# Patient Record
Sex: Male | Born: 1935 | Race: White | Hispanic: No | Smoking: Former smoker
Health system: Southern US, Community
[De-identification: ages and names within clinical notes are randomized; demographics above are authoritative.]

## PROBLEM LIST (undated history)

## (undated) DIAGNOSIS — I10 Essential (primary) hypertension: Secondary | ICD-10-CM

## (undated) DIAGNOSIS — E781 Pure hyperglyceridemia: Secondary | ICD-10-CM

---
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
## 2013-03-09 ENCOUNTER — Inpatient Hospital Stay (HOSPITAL_COMMUNITY)
Admission: EM | Admit: 2013-03-09 | Discharge: 2013-03-11 | DRG: 378 | Disposition: A | Discharge status: Home / Self Care | Payer: Medicare Other | Attending: Internal Medicine | Admitting: Internal Medicine

## 2013-03-09 ENCOUNTER — Emergency Department (HOSPITAL_COMMUNITY): Payer: Medicare Other

## 2013-03-09 ENCOUNTER — Encounter (HOSPITAL_COMMUNITY): Payer: Self-pay | Admitting: Emergency Medicine

## 2013-03-09 ENCOUNTER — Encounter (HOSPITAL_COMMUNITY)
Admission: EM | Disposition: A | Discharge status: Home / Self Care | Payer: Self-pay | Source: Home / Self Care | Attending: Internal Medicine

## 2013-03-09 DIAGNOSIS — R748 Abnormal levels of other serum enzymes: Secondary | ICD-10-CM | POA: Diagnosis present

## 2013-03-09 DIAGNOSIS — E872 Acidosis, unspecified: Secondary | ICD-10-CM

## 2013-03-09 DIAGNOSIS — D62 Acute posthemorrhagic anemia: Secondary | ICD-10-CM | POA: Diagnosis present

## 2013-03-09 DIAGNOSIS — K922 Gastrointestinal hemorrhage, unspecified: Secondary | ICD-10-CM | POA: Diagnosis present

## 2013-03-09 DIAGNOSIS — I10 Essential (primary) hypertension: Secondary | ICD-10-CM | POA: Diagnosis present

## 2013-03-09 DIAGNOSIS — K5731 Diverticulosis of large intestine without perforation or abscess with bleeding: Principal | ICD-10-CM | POA: Diagnosis present

## 2013-03-09 DIAGNOSIS — K644 Residual hemorrhoidal skin tags: Secondary | ICD-10-CM

## 2013-03-09 DIAGNOSIS — R7989 Other specified abnormal findings of blood chemistry: Secondary | ICD-10-CM | POA: Diagnosis present

## 2013-03-09 DIAGNOSIS — D126 Benign neoplasm of colon, unspecified: Secondary | ICD-10-CM

## 2013-03-09 DIAGNOSIS — E7800 Pure hypercholesterolemia, unspecified: Secondary | ICD-10-CM | POA: Diagnosis present

## 2013-03-09 DIAGNOSIS — I959 Hypotension, unspecified: Secondary | ICD-10-CM | POA: Diagnosis present

## 2013-03-09 DIAGNOSIS — F17200 Nicotine dependence, unspecified, uncomplicated: Secondary | ICD-10-CM | POA: Diagnosis present

## 2013-03-09 DIAGNOSIS — Z66 Do not resuscitate: Secondary | ICD-10-CM | POA: Diagnosis present

## 2013-03-09 HISTORY — PX: COLONOSCOPY: SHX5424

## 2013-03-09 HISTORY — DX: Essential (primary) hypertension: I10

## 2013-03-09 HISTORY — DX: Pure hyperglyceridemia: E78.1

## 2013-03-09 LAB — URINALYSIS W MICROSCOPIC + REFLEX CULTURE
BILIRUBIN URINE: NEGATIVE
Glucose, UA: NEGATIVE mg/dL
Hgb urine dipstick: NEGATIVE
KETONES UR: NEGATIVE mg/dL
Leukocytes, UA: NEGATIVE
NITRITE: NEGATIVE
PROTEIN: NEGATIVE mg/dL
Specific Gravity, Urine: 1.01 (ref 1.005–1.030)
Urobilinogen, UA: 0.2 mg/dL (ref 0.0–1.0)
pH: 5.5 (ref 5.0–8.0)

## 2013-03-09 LAB — PROTIME-INR
INR: 1.13 (ref 0.00–1.49)
Prothrombin Time: 14.3 seconds (ref 11.6–15.2)

## 2013-03-09 LAB — COMPREHENSIVE METABOLIC PANEL
ALT: 22 U/L (ref 0–53)
AST: 30 U/L (ref 0–37)
Albumin: 3.5 g/dL (ref 3.5–5.2)
Alkaline Phosphatase: 89 U/L (ref 39–117)
BUN: 17 mg/dL (ref 6–23)
CALCIUM: 9.3 mg/dL (ref 8.4–10.5)
CHLORIDE: 105 meq/L (ref 96–112)
CO2: 25 mEq/L (ref 19–32)
Creatinine, Ser: 0.99 mg/dL (ref 0.50–1.35)
GFR calc Af Amer: 84 mL/min — ABNORMAL LOW (ref >90)
GFR, EST NON AFRICAN AMERICAN: 73 mL/min — AB (ref >90)
GLUCOSE: 129 mg/dL — AB (ref 70–99)
Potassium: 3.7 mEq/L (ref 3.7–5.3)
SODIUM: 144 meq/L (ref 137–147)
Total Bilirubin: 0.4 mg/dL (ref 0.3–1.2)
Total Protein: 7.1 g/dL (ref 6.0–8.3)

## 2013-03-09 LAB — MRSA PCR SCREENING: MRSA by PCR: NEGATIVE

## 2013-03-09 LAB — CBC WITH DIFFERENTIAL/PLATELET
BASOS ABS: 0 10*3/uL (ref 0.0–0.1)
Basophils Relative: 0 % (ref 0–1)
EOS PCT: 1 % (ref 0–5)
Eosinophils Absolute: 0.1 10*3/uL (ref 0.0–0.7)
HCT: 37.3 % — ABNORMAL LOW (ref 39.0–52.0)
HEMOGLOBIN: 13.4 g/dL (ref 13.0–17.0)
LYMPHS ABS: 1.7 10*3/uL (ref 0.7–4.0)
Lymphocytes Relative: 14 % (ref 12–46)
MCH: 33 pg (ref 26.0–34.0)
MCHC: 35.9 g/dL (ref 30.0–36.0)
MCV: 91.9 fL (ref 78.0–100.0)
Monocytes Absolute: 1.5 10*3/uL — ABNORMAL HIGH (ref 0.1–1.0)
Monocytes Relative: 13 % — ABNORMAL HIGH (ref 3–12)
NEUTROS PCT: 72 % (ref 43–77)
Neutro Abs: 8.5 10*3/uL — ABNORMAL HIGH (ref 1.7–7.7)
Platelets: 164 10*3/uL (ref 150–400)
RBC: 4.06 MIL/uL — ABNORMAL LOW (ref 4.22–5.81)
RDW: 13.2 % (ref 11.5–15.5)
WBC: 11.7 10*3/uL — ABNORMAL HIGH (ref 4.0–10.5)

## 2013-03-09 LAB — TROPONIN I: Troponin I: <0.3 ng/mL (ref <0.30)

## 2013-03-09 LAB — CBC
HCT: 34.5 % — ABNORMAL LOW (ref 39.0–52.0)
HEMATOCRIT: 34 % — AB (ref 39.0–52.0)
Hemoglobin: 12.1 g/dL — ABNORMAL LOW (ref 13.0–17.0)
Hemoglobin: 12 g/dL — ABNORMAL LOW (ref 13.0–17.0)
MCH: 31.9 pg (ref 26.0–34.0)
MCH: 32.4 pg (ref 26.0–34.0)
MCHC: 35.1 g/dL (ref 30.0–36.0)
MCHC: 35.3 g/dL (ref 30.0–36.0)
MCV: 91 fL (ref 78.0–100.0)
MCV: 91.9 fL (ref 78.0–100.0)
Platelets: 145 10*3/uL — ABNORMAL LOW (ref 150–400)
Platelets: 163 10*3/uL (ref 150–400)
RBC: 3.79 MIL/uL — AB (ref 4.22–5.81)
RBC: 3.7 MIL/uL — ABNORMAL LOW (ref 4.22–5.81)
RDW: 13.2 % (ref 11.5–15.5)
RDW: 13.4 % (ref 11.5–15.5)
WBC: 8.5 10*3/uL (ref 4.0–10.5)
WBC: 9.1 10*3/uL (ref 4.0–10.5)

## 2013-03-09 LAB — LIPASE, BLOOD: Lipase: 60 U/L — ABNORMAL HIGH (ref 11–59)

## 2013-03-09 LAB — SAMPLE TO BLOOD BANK

## 2013-03-09 LAB — TYPE AND SCREEN
ABO/RH(D): O POS
Antibody Screen: NEGATIVE

## 2013-03-09 LAB — LACTIC ACID, PLASMA: Lactic Acid, Venous: 3.6 mmol/L — ABNORMAL HIGH (ref 0.5–2.2)

## 2013-03-09 SURGERY — COLONOSCOPY
Anesthesia: Moderate Sedation

## 2013-03-09 MED ORDER — MIDAZOLAM HCL 5 MG/5ML IJ SOLN
INTRAMUSCULAR | Status: DC | PRN
  Administered 2013-03-09: 2 mg via INTRAVENOUS
  Administered 2013-03-09: 1 mg via INTRAVENOUS

## 2013-03-09 MED ORDER — ACETAMINOPHEN 325 MG PO TABS: 650.0000 mg | ORAL_TABLET | Freq: Four times a day (QID) | ORAL | Status: DC | PRN

## 2013-03-09 MED ORDER — IOHEXOL 300 MG/ML  SOLN
100.0000 mL | Freq: Once | INTRAMUSCULAR | Status: AC | PRN
  Administered 2013-03-09: 100 mL via INTRAVENOUS

## 2013-03-09 MED ORDER — SODIUM CHLORIDE 0.9 % IV BOLUS (SEPSIS)
500.0000 mL | Freq: Once | INTRAVENOUS | Status: AC
  Administered 2013-03-09: 500 mL via INTRAVENOUS

## 2013-03-09 MED ORDER — MEPERIDINE HCL 50 MG/ML IJ SOLN
INTRAMUSCULAR | Status: AC
  Filled 2013-03-09: qty 1

## 2013-03-09 MED ORDER — ACETAMINOPHEN 650 MG RE SUPP: 650.0000 mg | Freq: Four times a day (QID) | RECTAL | Status: DC | PRN

## 2013-03-09 MED ORDER — ONDANSETRON HCL 4 MG PO TABS: 4.0000 mg | ORAL_TABLET | Freq: Four times a day (QID) | ORAL | Status: DC | PRN

## 2013-03-09 MED ORDER — SODIUM CHLORIDE 0.9 % IV BOLUS (SEPSIS)
500.0000 mL | Freq: Once | INTRAVENOUS | Status: AC
  Administered 2013-03-09: 06:00:00 via INTRAVENOUS

## 2013-03-09 MED ORDER — POTASSIUM CHLORIDE IN NACL 20-0.9 MEQ/L-% IV SOLN
INTRAVENOUS | Status: DC
  Administered 2013-03-09 – 2013-03-10 (×3): via INTRAVENOUS

## 2013-03-09 MED ORDER — SODIUM CHLORIDE 0.9 % IV SOLN
INTRAVENOUS | Status: DC
  Administered 2013-03-09 (×2): via INTRAVENOUS

## 2013-03-09 MED ORDER — IOHEXOL 300 MG/ML  SOLN
50.0000 mL | Freq: Once | INTRAMUSCULAR | Status: AC | PRN
Start: 2013-03-09 — End: 2013-03-09
  Administered 2013-03-09: 50 mL via ORAL

## 2013-03-09 MED ORDER — SODIUM CHLORIDE 0.9 % IV SOLN
INTRAVENOUS | Status: DC
  Administered 2013-03-09: 1000 mL via INTRAVENOUS

## 2013-03-09 MED ORDER — PEG 3350-KCL-NABCB-NACL-NASULF 236 G PO SOLR
4000.0000 mL | Freq: Once | ORAL | Status: DC
  Filled 2013-03-09: qty 4000

## 2013-03-09 MED ORDER — MIDAZOLAM HCL 5 MG/5ML IJ SOLN
INTRAMUSCULAR | Status: AC
  Filled 2013-03-09: qty 10

## 2013-03-09 MED ORDER — PANTOPRAZOLE SODIUM 40 MG IV SOLR
40.0000 mg | Freq: Once | INTRAVENOUS | Status: AC
  Administered 2013-03-09: 40 mg via INTRAVENOUS
  Filled 2013-03-09: qty 40

## 2013-03-09 MED ORDER — MEPERIDINE HCL 50 MG/ML IJ SOLN
INTRAMUSCULAR | Status: DC | PRN
  Administered 2013-03-09: 25 mg via INTRAVENOUS

## 2013-03-09 MED ORDER — STERILE WATER FOR IRRIGATION IR SOLN
Status: DC | PRN
  Administered 2013-03-09: 14:00:00

## 2013-03-09 MED ORDER — ONDANSETRON HCL 4 MG/2ML IJ SOLN: 4.0000 mg | Freq: Three times a day (TID) | INTRAMUSCULAR | Status: DC | PRN

## 2013-03-09 MED ORDER — SODIUM CHLORIDE 0.9 % IV SOLN: INTRAVENOUS | Status: DC

## 2013-03-09 MED ORDER — PANTOPRAZOLE SODIUM 40 MG IV SOLR
40.0000 mg | Freq: Two times a day (BID) | INTRAVENOUS | Status: DC
  Administered 2013-03-09 – 2013-03-10 (×3): 40 mg via INTRAVENOUS
  Filled 2013-03-09 (×4): qty 40

## 2013-03-09 MED ORDER — ONDANSETRON HCL 4 MG/2ML IJ SOLN
4.0000 mg | Freq: Four times a day (QID) | INTRAMUSCULAR | Status: DC | PRN
Start: 2013-03-09 — End: 2013-03-11

## 2013-03-09 NOTE — ED Provider Notes (Signed)
CT with diverticulosis.  HR 110s/  SBP 150.  Abdomen soft and nontender. Admission to stepdown d/w Dr. Roderic Palau. D/w Dr. Laural Golden of GI who will consult.   BP 154/65  Pulse 108  Temp(Src) 97.9 F (36.6 C) (Oral)  Resp 20  Ht 5' 5.5" (1.664 m)  Wt 188 lb (85.276 kg)  BMI 30.80 kg/m2  SpO2 98%   Ezequiel Essex, MD 03/09/13 [phone]

## 2013-03-09 NOTE — H&P (Signed)
[facility] History and Physical  Therron Sells QJJ:[id number] DOB: 08-23-1925 DOA: 03/09/2013  Referring physician: Dr. Wyvonnia Dusky, ER physician PCP: Kendrick Ranch, MD   Chief Complaint: rectal bleeding  HPI: Charles Ballard is a 80 y.o. male with history of hypertension who presents to the emergency room with complaints of rectal bleeding. Patient reports onset of symptoms occurring yesterday. He describes stools as dark in color, maroon color. He's had several episodes since onset, one in the emergency room and one after arrival to the step down unit. He began feeling lightheaded and dizzy which is when he called EMS. He was reported to be hypotensive with systolic blood pressures in the 80s on arrival. This improved with IV fluids. The patient denies any chest pain, shortness of breath, abdominal pain, vomiting, fever, cough or complaints. He has not been on NSAID, anticoagulation or antiplatelet agents. CT scan of the abdomen and pelvis was done in the emergency room which indicated diverticulosis but no acute findings. Patient will be admitted to the hospital for further treatments. He reports having a colonoscopy over 10 years ago in [place].   Review of Systems: Pertinent positives as per HPI, otherwise negative  Past Medical History  Diagnosis Date  . Hypertension   . High cholesterol    History reviewed. No pertinent past surgical history. Social History:  reports that he has been smoking.  He does not have any smokeless tobacco history on file. He reports that he does not drink alcohol or use illicit drugs.  No Known Allergies  Family history: father died due to complications of alcoholic cirrhosis, mother died in her 58s with dementia   Prior to Admission medications   Medication Sig Start Date End Date Taking? Authorizing Provider  lisinopril (PRINIVIL,ZESTRIL) 20 MG tablet Take 20 mg by mouth daily.   Yes Historical Provider, MD  Omega-3 Fatty Acids (FISH  OIL) 1000 MG CAPS Take by mouth.   Yes Historical Provider, MD   Physical Exam: Filed Vitals:   03/09/13 0836  BP: 144/71  Pulse: 105  Temp:   Resp:     BP 144/71  Pulse 105  Temp(Src) 97.9 F (36.6 C) (Oral)  Resp 20  Ht 5\' 5"  (1.651 m)  Wt 88.5 kg (195 lb 1.7 oz)  BMI 32.47 kg/m2  SpO2 97%  General:  Appears calm and comfortable Eyes: PERRL, normal lids, irises & conjunctiva ENT: grossly normal hearing, lips & tongue Neck: no LAD, masses or thyromegaly Cardiovascular: RRR, no m/r/g. No LE edema. Telemetry: SR, no arrhythmias  Respiratory: CTA bilaterally, no w/r/r. Normal respiratory effort. Abdomen: soft, ntnd Skin: no rash or induration seen on limited exam Musculoskeletal: grossly normal tone BUE/BLE Psychiatric: grossly normal mood and affect, speech fluent and appropriate Neurologic: grossly non-focal.          Labs on Admission:  Basic Metabolic Panel:  Recent Labs Lab 03/09/13 0523  NA 144  K 3.7  CL 105  CO2 25  GLUCOSE 129*  BUN 17  CREATININE 0.99  CALCIUM 9.3   Liver Function Tests:  Recent Labs Lab 03/09/13 0523  AST 30  ALT 22  ALKPHOS 89  BILITOT 0.4  PROT 7.1  ALBUMIN 3.5    Recent Labs Lab 03/09/13 0523  LIPASE 60*   No results found for this basename: AMMONIA,  in the last 168 hours CBC:  Recent Labs Lab 03/09/13 0523  WBC 11.7*  NEUTROABS 8.5*  HGB 13.4  HCT 37.3*  MCV 91.9  PLT 164  Cardiac Enzymes:  Recent Labs Lab 03/09/13 0523  TROPONINI <0.30    BNP (last 3 results) No results found for this basename: PROBNP,  in the last 8760 hours CBG: No results found for this basename: GLUCAP,  in the last 168 hours  Radiological Exams on Admission: Dg Chest 2 View  03/09/2013   CLINICAL DATA:  Rectal bleeding.  EXAM: CHEST  2 VIEW  COMPARISON:  None available for comparison at time of study interpretation.  FINDINGS: Cardiomediastinal silhouette is nonsuspicious. Slight increased lung volumes and  interstitial prominence without pleural effusions or focal consolidations. No pneumothorax. Soft tissue planes and included osseous structures are nonsuspicious.  IMPRESSION: Mild COPD without superimposed acute cardiopulmonary process.   Electronically Signed   By: Elon Alas   On: 03/09/2013 06:59   Ct Abdomen Pelvis W Contrast  03/09/2013   CLINICAL DATA:  Rectal bleeding  EXAM: CT ABDOMEN AND PELVIS WITH CONTRAST  TECHNIQUE: Multidetector CT imaging of the abdomen and pelvis was performed using the standard protocol following bolus administration of intravenous contrast.  CONTRAST:  60mL OMNIPAQUE IOHEXOL 300 MG/ML SOLN, 15mL OMNIPAQUE IOHEXOL 300 MG/ML SOLN  COMPARISON:  None.  FINDINGS: BODY WALL: Unremarkable.  LOWER CHEST: Unremarkable.  ABDOMEN/PELVIS:  Liver: No focal abnormality.  Possible fatty infiltration.  Biliary: Stones layering in the gallbladder. No wall thickening or gallbladder distention.  Pancreas: Unremarkable.  Spleen: Unremarkable.  Adrenals: Unremarkable.  Kidneys and ureters: 2 cm cyst exophytic from the lower pole right kidney. No hydronephrosis or nephrolithiasis.  Bladder: Unremarkable.  Reproductive: Mild enlargement of the prostate, deforming the bladder base.  Bowel: Colonic diverticulosis, mild for age. No other identifiable cause of lower GI bleed, including evidence of mass, ischemic colitis, or hemorrhoids. There is contrast in the ascending colon. No abnormal vascularity in the region. Normal appendix. 5 mm filling defect along the medial wall of the mid duodenum is likely the normal papilla. No ductal enlargement of the pancreas or biliary tree.  Retroperitoneum: No mass or adenopathy.  Peritoneum: No free fluid or gas.  Vascular: No acute abnormality.  OSSEOUS: No acute abnormalities. 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  IMPRESSION: 1. Colonic  diverticulosis. No other abnormality to explain GI bleeding. 2. Cholelithiasis.   Electronically Signed   By: Jorje Guild M.D.   On: 03/09/2013 07:04    EKG: Independently reviewed. Sinus tachy  Assessment/Plan Active Problems:   GI bleed   Hypertension   Elevated lipase   Elevated lactic acid level   1. GI bleeding. Appears to be lower GI bleed. We'll prophylactically place him on Protonix for now. Gastroenterology has been consulted. We'll keep the patient n.p.o. until seen by GI. We will cycle CBCs every 8 hours and transfuse as necessary. Hemoglobin appears to be stable at this time. 2. Elevated lipase. Does not appear to be clinically significant at this time. 3. Elevated lactic acid. Likely related to hypotension prior to admission. Will continue to follow. 4. Hypertension. Patient was taking lisinopril prior to admission. This will be held during this hospitalization. It can be restarted in the outpatient setting.  Code Status: DNR but not DNI Family Communication: none present, discussed with patient Disposition Plan: discharge home once improved  Time spent: 78mins  Borghild Thaker [facility] Pager [phone]

## 2013-03-09 NOTE — ED Notes (Signed)
Call next door neighbor Alta Corning @ [phone]

## 2013-03-09 NOTE — ED Notes (Signed)
He had a rectal bleed, seen some blood in the toilet per EMS. BP 89/62 NS bolus given and he was 518 Systolic after bolus.

## 2013-03-09 NOTE — Consult Note (Signed)
Referring Provider: No ref. provider found Primary Care Physician:  Kendrick Ranch, MD Primary Gastroenterologist:  Dr. Laural Golden  Reason for Consultation:  Rectal bleeding.  HPI:  Patient is a 80-year-old Caucasian male with history of colonic adenomas whose last colonoscopy was 10 years who noted burgundy maroon blood with bowel movement around 10 PM. He passed large amount of blood. He had 2 more bowel movements and each time he passed large blood. He did not experience abdominal pain nausea or vomiting but he became lightheaded and dizzy. He therefore called for help and was brought to ER by EMS. On initial presentation he was hypotensive and responded to IV fluids. He had another bowel movement in the emergency room and 2 while in ICU. He did not experience chest pain shortness of breath. He denies diarrhea constipation anorexia or weight loss. He has taken Advil in the past but not recently. She was admitted to ICU. H&H on admission was 13.4 and 37.3 He had another H&H at 10 AM it was 12.1 and 34.5. He is retired. He lost his wife 2 years ago. He lives alone in town in [place]. He smoked cigarettes for 10 years but quit in 1965. He drinks alcohol occasionally. He has 2 biologic children and stepson.   Past Medical History  Diagnosis Date  . Hypertension   . High cholesterol       bilateral cataract surgery. History of colonic polyps. Last colonoscopy was in [place] about 10 years ago.  History reviewed. No pertinent past surgical history.  Prior to Admission medications   Medication Sig Start Date End Date Taking? Authorizing Provider  lisinopril (PRINIVIL,ZESTRIL) 20 MG tablet Take 20 mg by mouth daily.   Yes Historical Provider, MD  Omega-3 Fatty Acids (FISH OIL) 1000 MG CAPS Take by mouth.   Yes Historical Provider, MD    Current Facility-Administered Medications  Medication Dose Route Frequency Provider Last Rate Last Dose  . 0.9 % NaCl with KCl 20  mEq/ L  infusion   Intravenous Continuous Kathie Dike, MD 75 mL/hr at 03/09/13 0950    . acetaminophen (TYLENOL) tablet 650 mg  650 mg Oral Q6H PRN Kathie Dike, MD       Or  . acetaminophen (TYLENOL) suppository 650 mg  650 mg Rectal Q6H PRN Kathie Dike, MD      . ondansetron (ZOFRAN) tablet 4 mg  4 mg Oral Q6H PRN Kathie Dike, MD       Or  . ondansetron (ZOFRAN) injection 4 mg  4 mg Intravenous Q6H PRN Kathie Dike, MD      . pantoprazole (PROTONIX) injection 40 mg  40 mg Intravenous Q12H Kathie Dike, MD   40 mg at 03/09/13 0950    Allergies as of 03/09/2013  . (No Known Allergies)    No family history on file.  History   Social History  . Marital Status: Widowed    Spouse Name: N/A    Number of Children: N/A  . Years of Education: N/A   Occupational History  . Not on file.   Social History Main Topics  . Smoking status: Current Every Day Smoker  . Smokeless tobacco: Not on file  . Alcohol Use: No  . Drug Use: No  . Sexual Activity: Not on file   Other Topics Concern  . Not on file   Social History Narrative  . No narrative on file    Review of Systems: See HPI, otherwise normal ROS  Physical Exam: Temp:  [  97.9 F (36.6 C)] 97.9 F (36.6 C) (01/14 0425) Pulse Rate:  [88-121] 105 (01/14 0836) Resp:  [11-21] 20 (01/14 0803) BP: (106-154)/(54-86) 144/71 mmHg (01/14 0836) SpO2:  [95 %-98 %] 97 % (01/14 0836) Weight:  [188 lb (85.276 kg)-195 lb 1.7 oz (88.5 kg)] 195 lb 1.7 oz (88.5 kg) (01/14 0836)  Pleasant well-developed well-nourished Caucasian male who is in no acute distress.  Conjunctivae was pink. Sclerae nonicteric. Oropharyngeal mucosa is normal. He has upper lower dentures in place. No neck masses or thyromegaly noted. Cardiac exam with a regular rhythm normal S1 and S2. No murmur or gallop noted. Lungs are clear to auscultation. Abdomen is symmetrical. Bowel sounds are normal. On palpation is soft and nontender without organomegaly or  masses. Rectal examination deferred. No peripheral edema or clubbing noted.  Lab Results:  Recent Labs  03/09/13 0523  WBC 11.7*  HGB 13.4  HCT 37.3*  PLT 164   BMET  Recent Labs  03/09/13 0523  NA 144  K 3.7  CL 105  CO2 25  GLUCOSE 129*  BUN 17  CREATININE 0.99  CALCIUM 9.3   LFT  Recent Labs  03/09/13 0523  PROT 7.1  ALBUMIN 3.5  AST 30  ALT 22  ALKPHOS 89  BILITOT 0.4   PT/INR  Recent Labs  03/09/13 0530  LABPROT 14.3  INR 1.13   Hepatitis Panel No results found for this basename: HEPBSAG, HCVAB, HEPAIGM, HEPBIGM,  in the last 72 hours  Studies/Results: Dg Chest 2 View  03/09/2013   CLINICAL DATA:  Rectal bleeding.  EXAM: CHEST  2 VIEW  COMPARISON:  None available for comparison at time of study interpretation.  FINDINGS: Cardiomediastinal silhouette is nonsuspicious. Slight increased lung volumes and interstitial prominence without pleural effusions or focal consolidations. No pneumothorax. Soft tissue planes and included osseous structures are nonsuspicious.  IMPRESSION: Mild COPD without superimposed acute cardiopulmonary process.   Electronically Signed   By: Elon Alas   On: 03/09/2013 06:59   Ct Abdomen Pelvis W Contrast  03/09/2013   CLINICAL DATA:  Rectal bleeding  EXAM: CT ABDOMEN AND PELVIS WITH CONTRAST  TECHNIQUE: Multidetector CT imaging of the abdomen and pelvis was performed using the standard protocol following bolus administration of intravenous contrast.  CONTRAST:  71mL OMNIPAQUE IOHEXOL 300 MG/ML SOLN, 154mL OMNIPAQUE IOHEXOL 300 MG/ML SOLN  COMPARISON:  None.  FINDINGS: BODY WALL: Unremarkable.  LOWER CHEST: Unremarkable.  ABDOMEN/PELVIS:  Liver: No focal abnormality.  Possible fatty infiltration.  Biliary: Stones layering in the gallbladder. No wall thickening or gallbladder distention.  Pancreas: Unremarkable.  Spleen: Unremarkable.  Adrenals: Unremarkable.  Kidneys and ureters: 2 cm cyst exophytic from the lower pole right  kidney. No hydronephrosis or nephrolithiasis.  Bladder: Unremarkable.  Reproductive: Mild enlargement of the prostate, deforming the bladder base.  Bowel: Colonic diverticulosis, mild for age. No other identifiable cause of lower GI bleed, including evidence of mass, ischemic colitis, or hemorrhoids. There is contrast in the ascending colon. No abnormal vascularity in the region. Normal appendix. 5 mm filling defect along the medial wall of the mid duodenum is likely the normal papilla. No ductal enlargement of the pancreas or biliary tree.  Retroperitoneum: No mass or adenopathy.  Peritoneum: No free fluid or gas.  Vascular: No acute abnormality.  OSSEOUS: No acute abnormalities. 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  IMPRESSION: 1. Colonic diverticulosis. No other abnormality to explain GI bleeding. 2. Cholelithiasis.  Electronically Signed   By: Jorje Guild M.D.   On: 03/09/2013 07:04    Assessment; Large-volume painless hematochezia and a healthy-appearing 80 year old Caucasian male who has history of colonic polyps and CT revealed colonic diverticulosis. He was hypotensive on presentation but now his blood pressure is normal. His hemoglobin has dropped by 1 g further drop as expected with hemodilution. Colonic diverticula bleed is suspected. Patient noted to have elevated lactic acid levels on admission most likely secondary to hypotension and poor perfusion. He does not appear to be acutely ill.  Recommendations; Request colonoscopy records from [facility] in [place]. Diagnostic colonoscopy to be performed this afternoon once patient has been prepped. Procedure reviewed with the patient he is agreeable.   LOS: 0 days   Mykaela Arena U  03/09/2013, 9:56 AM

## 2013-03-09 NOTE — Op Note (Signed)
COLONOSCOPY PROCEDURE REPORT  PATIENT:  Charles Ballard  MR#:  [id number] Birthdate:  1925-06-10, 80 y.o., male Endoscopist:  Dr. Rogene Houston, MD Referred By:  Dr. Kathie Dike, MD  Procedure Date: 03/09/2013  Procedure:   Colonoscopy  Indications:  Patient is an 80 year old Caucasian male who presents with large volume painless hematochezia. He has history of colonic polyps. His last colonoscopy was 10 years ago. He had CT on admission revealing colonic diverticulosis. He is undergoing diagnostic colonoscopy.  Informed Consent:  The procedure and risks were reviewed with the patient and informed consent was obtained.  Medications:  Demerol 25 mg IV Versed 3 mg IV  Description of procedure:  After a digital rectal exam was performed, that colonoscope was advanced from the anus through the rectum and colon to the area of the cecum, ileocecal valve and appendiceal orifice. The cecum was deeply intubated. These structures were well-seen and photographed for the record. From the level of the cecum and ileocecal valve, the scope was slowly and cautiously withdrawn. The mucosal surfaces were carefully surveyed utilizing scope tip to flexion to facilitate fold flattening as needed. The scope was pulled down into the rectum where a thorough exam including retroflexion was performed. Terminal ileum was also examined.  Findings:   Prep excellent. Normal mucosa of terminal ileum. Two small polyps were ablated and submitted together. One from cecum was removed using cold biopsy forceps and the second one from the hepatic flexure was cold snared. Single diverticulum at hepatic flexure with multiple diverticula at sigmoid colon but none with stigmata of bleed. Normal rectal mucosa. Prominent hemorrhoids below the dentate line.   Therapeutic/Diagnostic Maneuvers Performed:  See above  Complications:  None  Cecal Withdrawal Time:  10 minutes  Impression:  Normal mucosa of terminal  ileum. Two small polyps were removed as above and submitted together. These are located at cecum and hepatic flexure. Multiple diverticula at sigmoid colon along with one of hepatic flexure without stigmata of bleed. External hemorrhoids.   Comment; Suspect colonic diverticular bleed. No further workup unless evidence of recurrent bleed.    Recommendations:  Full liquid diet. Continue to monitor H&H and for evidence of rebleed. No aspirin or NSAIDs for 2 weeks.  Lason Eveland U  03/09/2013 2:40 PM  CC: Dr. Kendrick Ranch, MD & Dr. Rayne Du ref. provider found

## 2013-03-09 NOTE — ED Notes (Signed)
Large amount of bright red blood noted in stool, pt A/O X4, denies pain, VSS

## 2013-03-09 NOTE — ED Provider Notes (Signed)
CSN: [id number]     Arrival date & time 03/09/13  0425 History   First MD Initiated Contact with Patient 03/09/13 [phone]     Chief Complaint  Patient presents with  . Rectal Bleeding    HPI Pt was seen at 0440. Per EMS and pt report, c/o gradual onset and persistence of 3 episodes of bloody stools that began last night. Pt states his BM's were "red blood" and "liquid" only. States he also noticed blood on the toilet paper when he wiped after having the BM's. EMS states pt's BP was "89/62" on their arrival to scene, and increased to "SBP 139" after IVF bolus. Denies abd pain, no N/V, no rectal pain, no back pain, no CP/SOB.    Past Medical History  Diagnosis Date  . Hypertension   . High cholesterol    History reviewed. No pertinent past surgical history.  History  Substance Use Topics  . Smoking status: Current Every Day Smoker  . Smokeless tobacco: Not on file  . Alcohol Use: No    Review of Systems ROS: Statement: All systems negative except as marked or noted in the HPI; Constitutional: Negative for fever and chills. ; ; Eyes: Negative for eye pain, redness and discharge. ; ; ENMT: Negative for ear pain, hoarseness, nasal congestion, sinus pressure and sore throat. ; ; Cardiovascular: Negative for chest pain, palpitations, diaphoresis, dyspnea and peripheral edema. ; ; Respiratory: Negative for cough, wheezing and stridor. ; ; Gastrointestinal: Negative for nausea, vomiting, diarrhea, abdominal pain, hematemesis, jaundice and +bloody stool, +rectal bleeding. . ; ; Genitourinary: Negative for dysuria, flank pain and hematuria. ; ; Musculoskeletal: Negative for back pain and neck pain. Negative for swelling and trauma.; ; Skin: Negative for pruritus, rash, abrasions, blisters, bruising and skin lesion.; ; Neuro: Negative for headache, lightheadedness and neck stiffness. Negative for weakness, altered level of consciousness , altered mental status, extremity weakness, paresthesias,  involuntary movement, seizure and syncope.       Allergies  Review of patient's allergies indicates no known allergies.  Home Medications   Current Outpatient Rx  Name  Route  Sig  Dispense  Refill  . lisinopril (PRINIVIL,ZESTRIL) 20 MG tablet   Oral   Take 20 mg by mouth daily.         . Omega-3 Fatty Acids (FISH OIL) 1000 MG CAPS   Oral   Take by mouth.          BP 127/60  Pulse 121  Temp(Src) 97.9 F (36.6 C) (Oral)  Resp 21  Ht 5' 5.5" (1.664 m)  Wt 188 lb (85.276 kg)  BMI 30.80 kg/m2  SpO2 98% Filed Vitals:   03/09/13 0451 03/09/13 0453 03/09/13 0500 03/09/13 0600  BP: 121/59 127/60 106/58 136/63  Pulse: 118 121  89  Temp:      TempSrc:      Resp:   11 18  Height:      Weight:      SpO2:    98%    Physical Exam 0445: Physical examination:  Nursing notes reviewed; Vital signs and O2 SAT reviewed;  Constitutional: Well developed, Well nourished, Well hydrated, In no acute distress; Head:  Normocephalic, atraumatic; Eyes: EOMI, PERRL, No scleral icterus; ENMT: Mouth and pharynx normal, Mucous membranes moist; Neck: Supple, Full range of motion, No lymphadenopathy; Cardiovascular: Tachycardic rate and rhythm, No murmur, rub, or gallop; Respiratory: Breath sounds clear & equal bilaterally, No wheezes.  Speaking full sentences with ease, Normal respiratory effort/excursion; Chest: Nontender,  Movement normal; Abdomen: Soft, Nontender, Nondistended, Normal bowel sounds. Rectal exam performed w/permission of pt and ED Tech chaperone present.  Anal tone normal.  Non-tender, soft maroon stool in rectal vault, heme positive.  No fissures, no external hemorrhoids, no palp masses.;; Genitourinary: No CVA tenderness; Extremities: Pulses normal, No tenderness, No edema, No calf edema or asymmetry.; Neuro: AA&Ox3, Major CN grossly intact.  Speech clear. No gross focal motor or sensory deficits in extremities.; Skin: Color normal, Warm, Dry.   ED Course  Procedures     EKG  Interpretation    Date/Time:  Wednesday March 09 2013 04:52:23 EST Ventricular Rate:  103 PR Interval:  140 QRS Duration: 84 QT Interval:  366 QTC Calculation: 479 R Axis:   16 Text Interpretation:  Sinus tachycardia Otherwise normal ECG No previous ECGs available Confirmed by [facility]  MD, Nunzio Cory [id number]) on 03/09/2013 5:10:46 AM            MDM  MDM Reviewed: previous chart, nursing note and vitals Interpretation: labs, x-ray, CT scan and ECG     Results for orders placed during the hospital encounter of 03/09/13  CBC WITH DIFFERENTIAL      Result Value Range   WBC 11.7 (*) 4.0 - 10.5 K/uL   RBC 4.06 (*) 4.22 - 5.81 MIL/uL   Hemoglobin 13.4  13.0 - 17.0 g/dL   HCT 37.3 (*) 39.0 - 52.0 %   MCV 91.9  78.0 - 100.0 fL   MCH 33.0  26.0 - 34.0 pg   MCHC 35.9  30.0 - 36.0 g/dL   RDW 13.2  11.5 - 15.5 %   Platelets 164  150 - 400 K/uL   Neutrophils Relative % 72  43 - 77 %   Neutro Abs 8.5 (*) 1.7 - 7.7 K/uL   Lymphocytes Relative 14  12 - 46 %   Lymphs Abs 1.7  0.7 - 4.0 K/uL   Monocytes Relative 13 (*) 3 - 12 %   Monocytes Absolute 1.5 (*) 0.1 - 1.0 K/uL   Eosinophils Relative 1  0 - 5 %   Eosinophils Absolute 0.1  0.0 - 0.7 K/uL   Basophils Relative 0  0 - 1 %   Basophils Absolute 0.0  0.0 - 0.1 K/uL  COMPREHENSIVE METABOLIC PANEL      Result Value Range   Sodium 144  137 - 147 mEq/L   Potassium 3.7  3.7 - 5.3 mEq/L   Chloride 105  96 - 112 mEq/L   CO2 25  19 - 32 mEq/L   Glucose, Bld 129 (*) 70 - 99 mg/dL   BUN 17  6 - 23 mg/dL   Creatinine, Ser 0.99  0.50 - 1.35 mg/dL   Calcium 9.3  8.4 - 10.5 mg/dL   Total Protein 7.1  6.0 - 8.3 g/dL   Albumin 3.5  3.5 - 5.2 g/dL   AST 30  0 - 37 U/L   ALT 22  0 - 53 U/L   Alkaline Phosphatase 89  39 - 117 U/L   Total Bilirubin 0.4  0.3 - 1.2 mg/dL   GFR calc non Af Amer 73 (*) >90 mL/min   GFR calc Af Amer 84 (*) >90 mL/min  LIPASE, BLOOD      Result Value Range   Lipase 60 (*) 11 - 59 U/L  LACTIC ACID, PLASMA       Result Value Range   Lactic Acid, Venous 3.6 (*) 0.5 - 2.2 mmol/L  TROPONIN I  Result Value Range   Troponin I <0.30  <0.30 ng/mL  SAMPLE TO BLOOD BANK      Result Value Range   Blood Bank Specimen SAMPLE AVAILABLE FOR TESTING     Sample Expiration 03/12/2013       0705:  Orthostatic VS performed, SBP stable but pt did have increasing tachycardia and c/o feeling "lightheaded" when stood. Judicious IVF bolus given with improvement in tachycardia. H/H stable at this time. Abd continues benign. CT A/P and XR results pending. Sign out to Dr. Wyvonnia Dusky.    Alfonzo Feller, DO 03/09/13 [phone]

## 2013-03-10 DIAGNOSIS — D62 Acute posthemorrhagic anemia: Secondary | ICD-10-CM

## 2013-03-10 LAB — CBC
HCT: 30.8 % — ABNORMAL LOW (ref 39.0–52.0)
HEMATOCRIT: 30.4 % — AB (ref 39.0–52.0)
HEMATOCRIT: 34.2 % — AB (ref 39.0–52.0)
HEMATOCRIT: 34.7 % — AB (ref 39.0–52.0)
HEMOGLOBIN: 12 g/dL — AB (ref 13.0–17.0)
Hemoglobin: 11 g/dL — ABNORMAL LOW (ref 13.0–17.0)
Hemoglobin: 10.8 g/dL — ABNORMAL LOW (ref 13.0–17.0)
Hemoglobin: 12 g/dL — ABNORMAL LOW (ref 13.0–17.0)
MCH: 32.6 pg (ref 26.0–34.0)
MCH: 32.5 pg (ref 26.0–34.0)
MCH: 32.3 pg (ref 26.0–34.0)
MCH: 32.2 pg (ref 26.0–34.0)
MCHC: 35.7 g/dL (ref 30.0–36.0)
MCHC: 35.5 g/dL (ref 30.0–36.0)
MCHC: 35.1 g/dL (ref 30.0–36.0)
MCHC: 34.6 g/dL (ref 30.0–36.0)
MCV: 91.4 fL (ref 78.0–100.0)
MCV: 91.6 fL (ref 78.0–100.0)
MCV: 91.9 fL (ref 78.0–100.0)
MCV: 93 fL (ref 78.0–100.0)
Platelets: 127 10*3/uL — ABNORMAL LOW (ref 150–400)
Platelets: 135 10*3/uL — ABNORMAL LOW (ref 150–400)
Platelets: 147 10*3/uL — ABNORMAL LOW (ref 150–400)
Platelets: 154 10*3/uL (ref 150–400)
RBC: 3.37 MIL/uL — ABNORMAL LOW (ref 4.22–5.81)
RBC: 3.32 MIL/uL — ABNORMAL LOW (ref 4.22–5.81)
RBC: 3.72 MIL/uL — AB (ref 4.22–5.81)
RBC: 3.73 MIL/uL — ABNORMAL LOW (ref 4.22–5.81)
RDW: 13.4 % (ref 11.5–15.5)
RDW: 13.5 % (ref 11.5–15.5)
RDW: 13.5 % (ref 11.5–15.5)
RDW: 13.5 % (ref 11.5–15.5)
WBC: 5.7 10*3/uL (ref 4.0–10.5)
WBC: 5.7 10*3/uL (ref 4.0–10.5)
WBC: 5.6 10*3/uL (ref 4.0–10.5)
WBC: 7.8 10*3/uL (ref 4.0–10.5)

## 2013-03-10 LAB — LACTIC ACID, PLASMA: Lactic Acid, Venous: 2.6 mmol/L — ABNORMAL HIGH (ref 0.5–2.2)

## 2013-03-10 LAB — BASIC METABOLIC PANEL
BUN: 10 mg/dL (ref 6–23)
CO2: 25 meq/L (ref 19–32)
Calcium: 8.3 mg/dL — ABNORMAL LOW (ref 8.4–10.5)
Chloride: 109 mEq/L (ref 96–112)
Creatinine, Ser: 0.85 mg/dL (ref 0.50–1.35)
GFR calc non Af Amer: 77 mL/min — ABNORMAL LOW (ref >90)
GFR, EST AFRICAN AMERICAN: 90 mL/min — AB (ref >90)
Glucose, Bld: 112 mg/dL — ABNORMAL HIGH (ref 70–99)
POTASSIUM: 3.7 meq/L (ref 3.7–5.3)
Sodium: 145 mEq/L (ref 137–147)

## 2013-03-10 MED ORDER — LISINOPRIL 10 MG PO TABS
20.0000 mg | ORAL_TABLET | Freq: Every day | ORAL | Status: DC
  Administered 2013-03-10 – 2013-03-11 (×2): 20 mg via ORAL
  Filled 2013-03-10 (×2): qty 2

## 2013-03-10 MED ORDER — PANTOPRAZOLE SODIUM 40 MG PO TBEC
40.0000 mg | DELAYED_RELEASE_TABLET | Freq: Every day | ORAL | Status: DC
  Administered 2013-03-11: 40 mg via ORAL
  Filled 2013-03-10: qty 1

## 2013-03-10 NOTE — Care Management Note (Addendum)
    Page 1 of 1   03/11/2013     3:14:05 PM   CARE MANAGEMENT NOTE 03/11/2013  Patient:  Charles Ballard, Charles Ballard   Account Number:  [id number]  Date Initiated:  03/10/2013  Documentation initiated by:  Theophilus Kinds  Subjective/Objective Assessment:   Pt admitted from home with gi bleed. Pt lives alone and will return home at discharge. Pt is independent with ADL's.     Action/Plan:   No CM needs noted.   Anticipated DC Date:  03/12/2013   Anticipated DC Plan:  [place]  CM consult      Choice offered to / List presented to:             Status of service:  Completed, signed off Medicare Important Message given?  NA - LOS <3 / Initial given by admissions (If response is "NO", the following Medicare IM given date fields will be blank) Date Medicare IM given:   Date Additional Medicare IM given:    Discharge Disposition:  HOME/SELF CARE  Per UR Regulation:    If discussed at Long Length of Stay Meetings, dates discussed:    Comments:  03/11/13 [place], RN BSN CM Pt discharged home today. No CM needs noted.  03/10/13 [place], RN BSN CM

## 2013-03-10 NOTE — Progress Notes (Signed)
UR chart review completed.  

## 2013-03-10 NOTE — Progress Notes (Addendum)
Patient ID: Charles Ballard, male   DOB: 1925-09-16, 80 y.o.   MRN: [id number] Eating breakfast this am. He states he is hungry. He feels 100% better. No BM since colonoscopy. Denies any rectal bleeding. Underwent a colonoscopy yesterday which revealed:Impression:  Normal mucosa of terminal ileum.  Two small polyps were removed as above and submitted together. These are located at cecum and hepatic flexure.  Multiple diverticula at sigmoid colon along with one of hepatic flexure without stigmata of bleed.  External hemorrhoids.   CBC    Component Value Date/Time   WBC 5.7 03/10/2013 0445   RBC 3.32* 03/10/2013 0445   HGB 10.8* 03/10/2013 0445   HCT 30.4* 03/10/2013 0445   PLT 135* 03/10/2013 0445   MCV 91.6 03/10/2013 0445   MCH 32.5 03/10/2013 0445   MCHC 35.5 03/10/2013 0445   RDW 13.5 03/10/2013 0445   LYMPHSABS 1.7 03/09/2013 0523   MONOABS 1.5* 03/09/2013 0523   EOSABS 0.1 03/09/2013 0523   BASOSABS 0.0 03/09/2013 0523   Filed Vitals:   03/10/13 0200 03/10/13 0300 03/10/13 0400 03/10/13 0500  BP:   129/54   Pulse:   74 79  Temp:   98.6 F (37 C)   TempSrc:   Oral   Resp: 18 19 13 13   Height:      Weight:    196 lb 13.9 oz (89.3 kg)  SpO2:   96% 98%    Assessment/Plan: Probable diverticular bleed. Will continue to monitor. Advance diet.   GI attending note; Pantoprazole change to oral route.

## 2013-03-10 NOTE — Progress Notes (Signed)
[facility] PROGRESS NOTE  Charles Ballard HEN:[id number] DOB: [birth year] DOA: 03/09/2013 PCP: Kendrick Ranch, MD  Assessment/Plan: 1. GI bleeding, likely diverticular. Clinically, the bleeding appears to have resolved. Patient underwent colonoscopy yesterday with results as below. We'll continue to advance diet and monitor for recurrence of bleeding. No aspirin or NSAID for 2 weeks. 2. Acute blood loss anemia, mild. We'll continue to monitor. No indication for transfusion at this time. 3.  hypertension. Restart lisinopril.  Code Status: full code Family Communication: discussed with family Disposition Plan: Discharge home once improved   Consultants:  Gastroenterology, Dr. Laural Golden  Procedures: Colonoscopy: Prep excellent.  Normal mucosa of terminal ileum.  Two small polyps were ablated and submitted together. One from cecum was removed using cold biopsy forceps and the second one from the hepatic flexure was cold snared.  Single diverticulum at hepatic flexure with multiple diverticula at sigmoid colon but none with stigmata of bleed.  Normal rectal mucosa.  Prominent hemorrhoids below the dentate line   Antibiotics:  none  HPI/Subjective: Feeling significantly improved. No bowel movement since yesterday. Tolerating diet. No vomiting. Denies any lightheadedness or dizziness.  Objective: Filed Vitals:   03/10/13 0800  BP: 156/78  Pulse: 116  Temp: 97.9 F (36.6 C)  Resp: 25    Intake/Output Summary (Last 24 hours) at 03/10/13 1124 Last data filed at 03/10/13 0800  Gross per 24 hour  Intake 1662.5 ml  Output      0 ml  Net 1662.5 ml   Filed Weights   03/09/13 0425 03/09/13 0836 03/10/13 0500  Weight: 85.276 kg (188 lb) 88.5 kg (195 lb 1.7 oz) 89.3 kg (196 lb 13.9 oz)    Exam:   General:  NAD  Cardiovascular: S1, S2 RRR  Respiratory: CTA B  Abdomen: soft, nt, nd, bs+  Musculoskeletal: no edema b/l   Data Reviewed: Basic Metabolic  Panel:  Recent Labs Lab 03/09/13 0523 03/10/13 0445  NA 144 145  K 3.7 3.7  CL 105 109  CO2 25 25  GLUCOSE 129* 112*  BUN 17 10  CREATININE 0.99 0.85  CALCIUM 9.3 8.3*   Liver Function Tests:  Recent Labs Lab 03/09/13 0523  AST 30  ALT 22  ALKPHOS 89  BILITOT 0.4  PROT 7.1  ALBUMIN 3.5    Recent Labs Lab 03/09/13 0523  LIPASE 60*   No results found for this basename: AMMONIA,  in the last 168 hours CBC:  Recent Labs Lab 03/09/13 0523 03/09/13 0958 03/09/13 1714 03/10/13 0129 03/10/13 0445  WBC 11.7* 8.5 9.1 5.7 5.7  NEUTROABS 8.5*  --   --   --   --   HGB 13.4 12.1* 12.0* 11.0* 10.8*  HCT 37.3* 34.5* 34.0* 30.8* 30.4*  MCV 91.9 91.0 91.9 91.4 91.6  PLT 164 145* 163 127* 135*   Cardiac Enzymes:  Recent Labs Lab 03/09/13 0523  TROPONINI <0.30   BNP (last 3 results) No results found for this basename: PROBNP,  in the last 8760 hours CBG: No results found for this basename: GLUCAP,  in the last 168 hours  Recent Results (from the past 240 hour(s))  MRSA PCR SCREENING     Status: None   Collection Time    03/09/13  8:21 AM      Result Value Range Status   MRSA by PCR NEGATIVE  NEGATIVE Final   Comment:            The GeneXpert MRSA Assay (FDA     approved  for NASAL specimens     only), is one component of a     comprehensive MRSA colonization     surveillance program. It is not     intended to diagnose MRSA     infection nor to guide or     monitor treatment for     MRSA infections.     Studies: Dg Chest 2 View  03/09/2013   CLINICAL DATA:  Rectal bleeding.  EXAM: CHEST  2 VIEW  COMPARISON:  None available for comparison at time of study interpretation.  FINDINGS: Cardiomediastinal silhouette is nonsuspicious. Slight increased lung volumes and interstitial prominence without pleural effusions or focal consolidations. No pneumothorax. Soft tissue planes and included osseous structures are nonsuspicious.  IMPRESSION: Mild COPD without  superimposed acute cardiopulmonary process.   Electronically Signed   By: Elon Alas   On: 03/09/2013 06:59   Ct Abdomen Pelvis W Contrast  03/09/2013   CLINICAL DATA:  Rectal bleeding  EXAM: CT ABDOMEN AND PELVIS WITH CONTRAST  TECHNIQUE: Multidetector CT imaging of the abdomen and pelvis was performed using the standard protocol following bolus administration of intravenous contrast.  CONTRAST:  66mL OMNIPAQUE IOHEXOL 300 MG/ML SOLN, 185mL OMNIPAQUE IOHEXOL 300 MG/ML SOLN  COMPARISON:  None.  FINDINGS: BODY WALL: Unremarkable.  LOWER CHEST: Unremarkable.  ABDOMEN/PELVIS:  Liver: No focal abnormality.  Possible fatty infiltration.  Biliary: Stones layering in the gallbladder. No wall thickening or gallbladder distention.  Pancreas: Unremarkable.  Spleen: Unremarkable.  Adrenals: Unremarkable.  Kidneys and ureters: 2 cm cyst exophytic from the lower pole right kidney. No hydronephrosis or nephrolithiasis.  Bladder: Unremarkable.  Reproductive: Mild enlargement of the prostate, deforming the bladder base.  Bowel: Colonic diverticulosis, mild for age. No other identifiable cause of lower GI bleed, including evidence of mass, ischemic colitis, or hemorrhoids. There is contrast in the ascending colon. No abnormal vascularity in the region. Normal appendix. 5 mm filling defect along the medial wall of the mid duodenum is likely the normal papilla. No ductal enlargement of the pancreas or biliary tree.  Retroperitoneum: No mass or adenopathy.  Peritoneum: No free fluid or gas.  Vascular: No acute abnormality.  OSSEOUS: No acute abnormalities. 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  IMPRESSION: 1. Colonic diverticulosis. No other abnormality to explain GI bleeding. 2. Cholelithiasis.   Electronically Signed   By: Jorje Guild M.D.   On: 03/09/2013 07:04    Scheduled Meds: . lisinopril  20 mg Oral Daily   . pantoprazole (PROTONIX) IV  40 mg Intravenous Q12H  . polyethylene glycol  4,000 mL Oral Once   Continuous Infusions:   Active Problems:   GI bleed   Hypertension   Elevated lipase   Elevated lactic acid level    Time spent: 31mins    Marlyn Tondreau  [facility] Pager [phone]. If 7PM-7AM, please contact night-coverage at [email or web address], password [facility] 03/10/2013, 11:24 AM  LOS: 1 day

## 2013-03-11 ENCOUNTER — Encounter (HOSPITAL_COMMUNITY): Payer: Self-pay | Admitting: Internal Medicine

## 2013-03-11 LAB — CBC
HCT: 30 % — ABNORMAL LOW (ref 39.0–52.0)
HEMATOCRIT: 34.5 % — AB (ref 39.0–52.0)
Hemoglobin: 10.7 g/dL — ABNORMAL LOW (ref 13.0–17.0)
Hemoglobin: 12.1 g/dL — ABNORMAL LOW (ref 13.0–17.0)
MCH: 32.7 pg (ref 26.0–34.0)
MCH: 32.4 pg (ref 26.0–34.0)
MCHC: 35.7 g/dL (ref 30.0–36.0)
MCHC: 35.1 g/dL (ref 30.0–36.0)
MCV: 91.7 fL (ref 78.0–100.0)
MCV: 92.2 fL (ref 78.0–100.0)
PLATELETS: 123 10*3/uL — AB (ref 150–400)
Platelets: 164 10*3/uL (ref 150–400)
RBC: 3.27 MIL/uL — ABNORMAL LOW (ref 4.22–5.81)
RBC: 3.74 MIL/uL — ABNORMAL LOW (ref 4.22–5.81)
RDW: 13.2 % (ref 11.5–15.5)
RDW: 13.4 % (ref 11.5–15.5)
WBC: 6.2 10*3/uL (ref 4.0–10.5)
WBC: 6.3 10*3/uL (ref 4.0–10.5)

## 2013-03-11 MED ORDER — PANTOPRAZOLE SODIUM 40 MG PO TBEC: 40.0000 mg | DELAYED_RELEASE_TABLET | Freq: Every day | ORAL | Status: AC

## 2013-03-11 NOTE — Progress Notes (Signed)
Subjective; Patient has no complaints. He has not had a bowel movement since his colonoscopy. He has good appetite. He is ambulating in the room without postural symptoms.  Objective; BP 122/72  Pulse 95  Temp(Src) 98 F (36.7 C) (Oral)  Resp 21  Ht 5\' 5"  (1.651 m)  Wt 195 lb 8 oz (88.678 kg)  BMI 32.53 kg/m2  SpO2 95% Abdomen is soft and nontender without organomegaly or masses. No LE edema noted.  Lab data; H&H from early this morning was 10.7 and 30.0 and platelet count 123K H&H from 9:13 AM today is 12.1 and 34.5 and platelet count 164K.  Colonic polyp biopsies pending.  Assessment; Lower GI bleed secondary to colonic diverticulosis. No evidence of recurrent bleed in the last 48 hours. H&H is stable.  Recommendations; Patient ready for discharge. Patient advised not to take aspirin or NSAIDs for 2 weeks. I will followup with him regarding biopsy results. Will check H&H in 2 weeks(office will contact him).

## 2013-03-11 NOTE — Discharge Planning (Signed)
Pt ride has now arrived and he's being wheeled to car by RN and family.

## 2013-03-11 NOTE — Discharge Planning (Signed)
Pt stated that he was ready to be [facility] and he had no pain.  Pt's IV X2 was removed and he was given DC papers and education.  Pt was educated about what s/sx for GI bleed that may cause him to need to call the doctor or return to the hospital.  Pt also given script for protonix.  He will be wheeled to car by RN and family once ride arrives.

## 2013-03-11 NOTE — Discharge Summary (Signed)
Physician Discharge Summary  Charles Ballard:[id number] DOB: [birth year] DOA: 03/09/2013  PCP: Kendrick Ranch, MD  Admit date: 03/09/2013 Discharge date: 03/11/2013  Time spent: 40 minutes  Recommendations for Outpatient Follow-up:  1. Follow up with Dr. Laural Golden in 2 weeks 2. Follow up with primary care doctor in 2 weeks  Discharge Diagnoses:  Active Problems:   GI bleed   Hypertension   Elevated lipase   Elevated lactic acid level   Discharge Condition: improved  Diet recommendation: low salt  Filed Weights   03/09/13 0836 03/10/13 0500 03/11/13 0629  Weight: 88.5 kg (195 lb 1.7 oz) 89.3 kg (196 lb 13.9 oz) 88.678 kg (195 lb 8 oz)    History of present illness:  Charles Ballard is a 80 y.o. male with history of hypertension who presents to the emergency room with complaints of rectal bleeding. Patient reports onset of symptoms occurring yesterday. He describes stools as dark in color, maroon color. He's had several episodes since onset, one in the emergency room and one after arrival to the step down unit. He began feeling lightheaded and dizzy which is when he called EMS. He was reported to be hypotensive with systolic blood pressures in the 80s on arrival. This improved with IV fluids. The patient denies any chest pain, shortness of breath, abdominal pain, vomiting, fever, cough or complaints. He has not been on NSAID, anticoagulation or antiplatelet agents. CT scan of the abdomen and pelvis was done in the emergency room which indicated diverticulosis but no acute findings. Patient will be admitted to the hospital for further treatments. He reports having a colonoscopy over 10 years ago in [place].   Hospital Course:  This patient was admitted to the hospital with rectal bleeding. He was noted to be hypotensive on admission with an elevated lactic acid. He was admitted to the step down unit for monitoring. Hemoglobin and crit remained relatively stable in the hospital  he did not require any blood transfusions. He was seen by gastroenterology who performed a colonoscopy. Results indicated a possible diverticular bleed. The patient was monitored in the hospital did not have any further bleeding after colonoscopy. His hemoglobin has been stable. He is tolerating a solid diet. He has been advised not to use any NSAID or aspirin for the next 2 weeks. He'll followup with gastroenterology in 2 weeks time. He should followup with his primary care physician in 2 weeks as well..  Procedures: Colonosocopy: Normal mucosa of terminal ileum.  Two small polyps were removed as above and submitted together. These are located at cecum and hepatic flexure.  Multiple diverticula at sigmoid colon along with one of hepatic flexure without stigmata of bleed.  External hemorrhoids.  Comment;  Suspect colonic diverticular bleed.  No further workup unless evidence of recurrent bleed.    Consultations:  Gastroenterology, Dr. Laural Golden  Discharge Exam: Filed Vitals:   03/11/13 0819  BP: 122/72  Pulse:   Temp:   Resp:     General: NAd Cardiovascular: S1, S2 RRR Respiratory: CTA B  Discharge Instructions  Discharge Orders   Future Orders Complete By Expires   Diet - low sodium heart healthy  As directed    Increase activity slowly  As directed        Medication List         lisinopril 20 MG tablet  Commonly known as:  PRINIVIL,ZESTRIL  Take 20 mg by mouth daily.     Omega 3 1000 MG Caps  Take 1,000 mg by mouth  daily.     pantoprazole 40 MG tablet  Commonly known as:  PROTONIX  Take 1 tablet (40 mg total) by mouth daily.       No Known Allergies    The results of significant diagnostics from this hospitalization (including imaging, microbiology, ancillary and laboratory) are listed below for reference.    Significant Diagnostic Studies: Dg Chest 2 View  03/09/2013   CLINICAL DATA:  Rectal bleeding.  EXAM: CHEST  2 VIEW  COMPARISON:  None available for  comparison at time of study interpretation.  FINDINGS: Cardiomediastinal silhouette is nonsuspicious. Slight increased lung volumes and interstitial prominence without pleural effusions or focal consolidations. No pneumothorax. Soft tissue planes and included osseous structures are nonsuspicious.  IMPRESSION: Mild COPD without superimposed acute cardiopulmonary process.   Electronically Signed   By: Elon Alas   On: 03/09/2013 06:59   Ct Abdomen Pelvis W Contrast  03/09/2013   CLINICAL DATA:  Rectal bleeding  EXAM: CT ABDOMEN AND PELVIS WITH CONTRAST  TECHNIQUE: Multidetector CT imaging of the abdomen and pelvis was performed using the standard protocol following bolus administration of intravenous contrast.  CONTRAST:  71mL OMNIPAQUE IOHEXOL 300 MG/ML SOLN, 171mL OMNIPAQUE IOHEXOL 300 MG/ML SOLN  COMPARISON:  None.  FINDINGS: BODY WALL: Unremarkable.  LOWER CHEST: Unremarkable.  ABDOMEN/PELVIS:  Liver: No focal abnormality.  Possible fatty infiltration.  Biliary: Stones layering in the gallbladder. No wall thickening or gallbladder distention.  Pancreas: Unremarkable.  Spleen: Unremarkable.  Adrenals: Unremarkable.  Kidneys and ureters: 2 cm cyst exophytic from the lower pole right kidney. No hydronephrosis or nephrolithiasis.  Bladder: Unremarkable.  Reproductive: Mild enlargement of the prostate, deforming the bladder base.  Bowel: Colonic diverticulosis, mild for age. No other identifiable cause of lower GI bleed, including evidence of mass, ischemic colitis, or hemorrhoids. There is contrast in the ascending colon. No abnormal vascularity in the region. Normal appendix. 5 mm filling defect along the medial wall of the mid duodenum is likely the normal papilla. No ductal enlargement of the pancreas or biliary tree.  Retroperitoneum: No mass or adenopathy.  Peritoneum: No free fluid or gas.  Vascular: No acute abnormality.  OSSEOUS: No acute abnormalities. Lower lumbar spinal canal stenosis secondary to  congenital narrowing with superimposed degenerative disc spurring and facet/ligament overgrowth.  IMPRESSION: 1. Colonic diverticulosis. No other abnormality to explain GI bleeding. 2. Cholelithiasis.   Electronically Signed   By: Jorje Guild M.D.   On: 03/09/2013 07:04    Microbiology: Recent Results (from the past 240 hour(s))  MRSA PCR SCREENING     Status: None   Collection Time    03/09/13  8:21 AM      Result Value Range Status   MRSA by PCR NEGATIVE  NEGATIVE Final   Comment:            The GeneXpert MRSA Assay (FDA     approved for NASAL specimens     only), is one component of a     comprehensive MRSA colonization     surveillance program. It is not     intended to diagnose MRSA     infection nor to guide or     monitor treatment for     MRSA infections.     Labs: Basic Metabolic Panel:  Recent Labs Lab 03/09/13 0523 03/10/13 0445  NA 144 145  K 3.7 3.7  CL 105 109  CO2 25 25  GLUCOSE 129* 112*  BUN 17 10  CREATININE 0.99 0.85  CALCIUM 9.3 8.3*   Liver Function Tests:  Recent Labs Lab 03/09/13 0523  AST 30  ALT 22  ALKPHOS 89  BILITOT 0.4  PROT 7.1  ALBUMIN 3.5    Recent Labs Lab 03/09/13 0523  LIPASE 60*   No results found for this basename: AMMONIA,  in the last 168 hours CBC:  Recent Labs Lab 03/09/13 0523  03/10/13 0445 03/10/13 1232 03/10/13 1724 03/11/13 0126 03/11/13 0913  WBC 11.7*  < > 5.7 5.6 7.8 6.2 6.3  NEUTROABS 8.5*  --   --   --   --   --   --   HGB 13.4  < > 10.8* 12.0* 12.0* 10.7* 12.1*  HCT 37.3*  < > 30.4* 34.2* 34.7* 30.0* 34.5*  MCV 91.9  < > 91.6 91.9 93.0 91.7 92.2  PLT 164  < > 135* 147* 154 123* 164  < > = values in this interval not displayed. Cardiac Enzymes:  Recent Labs Lab 03/09/13 0523  TROPONINI <0.30   BNP: BNP (last 3 results) No results found for this basename: PROBNP,  in the last 8760 hours CBG: No results found for this basename: GLUCAP,  in the last 168  hours     Signed:  MEMON,JEHANZEB  [facility] 03/11/2013, 3:09 PM

## 2017-04-16 ENCOUNTER — Other Ambulatory Visit: Payer: Self-pay

## 2017-04-16 ENCOUNTER — Emergency Department (HOSPITAL_COMMUNITY)
Admission: EM | Admit: 2017-04-16 | Discharge: 2017-04-16 | Disposition: A | Discharge status: Home / Self Care | Payer: Medicare PPO | Attending: Emergency Medicine | Admitting: Emergency Medicine

## 2017-04-16 ENCOUNTER — Emergency Department (HOSPITAL_COMMUNITY): Payer: Medicare PPO

## 2017-04-16 ENCOUNTER — Encounter (HOSPITAL_COMMUNITY): Payer: Self-pay | Admitting: Emergency Medicine

## 2017-04-16 DIAGNOSIS — Y92511 Restaurant or cafe as the place of occurrence of the external cause: Secondary | ICD-10-CM | POA: Diagnosis not present

## 2017-04-16 DIAGNOSIS — W01198A Fall on same level from slipping, tripping and stumbling with subsequent striking against other object, initial encounter: Secondary | ICD-10-CM | POA: Insufficient documentation

## 2017-04-16 DIAGNOSIS — Y9301 Activity, walking, marching and hiking: Secondary | ICD-10-CM | POA: Diagnosis not present

## 2017-04-16 DIAGNOSIS — S20229A Contusion of unspecified back wall of thorax, initial encounter: Secondary | ICD-10-CM | POA: Insufficient documentation

## 2017-04-16 DIAGNOSIS — Z79899 Other long term (current) drug therapy: Secondary | ICD-10-CM | POA: Diagnosis not present

## 2017-04-16 DIAGNOSIS — Y999 Unspecified external cause status: Secondary | ICD-10-CM | POA: Insufficient documentation

## 2017-04-16 DIAGNOSIS — I10 Essential (primary) hypertension: Secondary | ICD-10-CM | POA: Diagnosis not present

## 2017-04-16 DIAGNOSIS — W19XXXA Unspecified fall, initial encounter: Secondary | ICD-10-CM

## 2017-04-16 DIAGNOSIS — Z87891 Personal history of nicotine dependence: Secondary | ICD-10-CM | POA: Diagnosis not present

## 2017-04-16 DIAGNOSIS — S299XXA Unspecified injury of thorax, initial encounter: Secondary | ICD-10-CM | POA: Diagnosis present

## 2017-04-16 MED ORDER — BACITRACIN ZINC 500 UNIT/GM EX OINT
TOPICAL_OINTMENT | CUTANEOUS | Status: AC
  Filled 2017-04-16: qty 0.9

## 2017-04-16 NOTE — Discharge Instructions (Signed)
Motrin, or Tylenol for pain.  Ice or heat as needed.

## 2017-04-16 NOTE — ED Notes (Signed)
Cleaned skin tear to right elbow and applied bacitrican and bandage, patient tolerated well.

## 2017-04-16 NOTE — ED Triage Notes (Addendum)
Pt fell while trying to step on a curb. Family member states the pt had no loc and he has skin tear to the right elbow. Pt is alert and oriented. Family member states pt's head hit the the curb and c/o back and rib pain.

## 2017-04-16 NOTE — ED Provider Notes (Signed)
[facility] EMERGENCY DEPARTMENT Provider Note   CSN: [id number] Arrival date & time: 04/16/17  2013     History   Chief Complaint Chief Complaint  Patient presents with  . Fall    HPI Charles Ballard is a 84 y.o. male.  Lost his balance stepping down from a curb while leaving a steak restaurant in [employer].  He fell backwards.  He hit his back on the concrete behind him.  Hit his head but does not complain of headache or abrasion.  Is not anticoagulated.  During the ride home he complained of pain in his back and ribs and was brought here by family.  HPI  Past Medical History:  Diagnosis Date  . High triglycerides   . Hypertension     Patient Active Problem List   Diagnosis Date Noted  . GI bleed 03/09/2013  . Hypertension 03/09/2013  . Elevated lipase 03/09/2013  . Elevated lactic acid level 03/09/2013    Past Surgical History:  Procedure Laterality Date  . COLONOSCOPY N/A 03/09/2013   Procedure: COLONOSCOPY;  Surgeon: Rogene Houston, MD;  Location: [facility]ITE;  Service: Endoscopy;  Laterality: N/A;  . Skin Grafts  1933       Home Medications    Prior to Admission medications   Medication Sig Start Date End Date Taking? Authorizing Provider  lisinopril (PRINIVIL,ZESTRIL) 20 MG tablet Take 20 mg by mouth daily.    [provider]  Omega 3 1000 MG CAPS Take 1,000 mg by mouth daily.    [provider]  pantoprazole (PROTONIX) 40 MG tablet Take 1 tablet (40 mg total) by mouth daily. 03/11/13   Kathie Dike, MD    Family History No family history on file.  Social History Social History   Tobacco Use  . Smoking status: Former Smoker    Years: 10.00  . Smokeless tobacco: Former Systems developer    Quit date: 02/24/1964  Substance Use Topics  . Alcohol use: No  . Drug use: No     Allergies   Patient has no known allergies.   Review of Systems Review of Systems  Constitutional: Negative for appetite change, chills, diaphoresis,  fatigue and fever.  HENT: Negative for mouth sores, sore throat and trouble swallowing.   Eyes: Negative for visual disturbance.  Respiratory: Negative for cough, chest tightness, shortness of breath and wheezing.        Bilateral posterior lateral rib pain  Cardiovascular: Negative for chest pain.  Gastrointestinal: Negative for abdominal distention, abdominal pain, diarrhea, nausea and vomiting.  Endocrine: Negative for polydipsia, polyphagia and polyuria.  Genitourinary: Negative for dysuria, frequency and hematuria.  Musculoskeletal: Positive for back pain. Negative for gait problem.  Skin: Negative for color change, pallor and rash.  Neurological: Negative for dizziness, syncope, light-headedness and headaches.  Hematological: Does not bruise/bleed easily.  Psychiatric/Behavioral: Negative for behavioral problems and confusion.     Physical Exam Updated Vital Signs BP (!) 166/82 (BP Location: Right Arm)   Pulse 91   Temp 98.8 F (37.1 C) (Oral)   Resp 18   Ht 5\' 5"  (1.651 m)   Wt 84.8 kg (187 lb)   SpO2 98%   BMI 31.12 kg/m   Physical Exam  Constitutional: He is oriented to person, place, and time. He appears well-developed and well-nourished. No distress.  HENT:  Head: Normocephalic.  No sinus tract to the head.  No abrasion or contusion.  Nontender over the scalp and skull.  Mentating well.  No  blood over the TMs, mastoids, or from the ears nose or mouth.  No midline neck pain.  Eyes: Conjunctivae are normal. Pupils are equal, round, and reactive to light. No scleral icterus.  Neck: Normal range of motion. Neck supple. No thyromegaly present.  Cardiovascular: Normal rate and regular rhythm. Exam reveals no gallop and no friction rub.  No murmur heard. Pulmonary/Chest: Effort normal and breath sounds normal. No respiratory distress. He has no wheezes. He has no rales.  Abdominal: Soft. Bowel sounds are normal. He exhibits no distension. There is no tenderness. There is  no rebound.  Musculoskeletal: Normal range of motion.  Some midline thoracic spine tenderness and posterior rib soft tissue tenderness.  No crepitus.  Symmetric bilateral breath sounds.  Skin tear to right elbow.  Full range of motion.  Neurological: He is alert and oriented to person, place, and time.  Skin: Skin is warm and dry. No rash noted.  Psychiatric: He has a normal mood and affect. His behavior is normal.     ED Treatments / Results  Labs (all labs ordered are listed, but only abnormal results are displayed) Labs Reviewed - No data to display  EKG  EKG Interpretation None       Radiology Dg Chest 1 View  Result Date: 04/16/2017 CLINICAL DATA:  Fall with back and rib pain EXAM: CHEST 1 VIEW COMPARISON:  03/09/2013 FINDINGS: Linear scarring or atelectasis at the left base. Possible tiny left effusion. Normal heart size. Aortic atherosclerosis. No pneumothorax. IMPRESSION: Linear atelectasis at the left base with possible tiny pleural effusion. Negative for a pneumothorax. Electronically Signed   By: Donavan Foil M.D.   On: 04/16/2017 21:41   Dg Thoracic Spine 2 View  Result Date: 04/16/2017 CLINICAL DATA:  Fall with back pain EXAM: THORACIC SPINE 2 VIEWS COMPARISON:  03/09/2013 chest x-ray FINDINGS: Thoracic alignment shows minimal scoliosis. The vertebral body heights appear maintained. Diffuse degenerative changes. IMPRESSION: No acute osseous abnormality Electronically Signed   By: Donavan Foil M.D.   On: 04/16/2017 21:40    Procedures Procedures (including critical care time)  Medications Ordered in ED Medications  bacitracin 500 UNIT/GM ointment (not administered)     Initial Impression / Assessment and Plan / ED Course  I have reviewed the triage vital signs and the nursing notes.  Pertinent labs & imaging results that were available during my care of the patient were reviewed by me and considered in my medical decision making (see chart for details).      Rustic spine and chest x-ray are negative for acute abnormalities.  Skin tear was dressed discharged home with Motrin Tylenol recheck as needed  Final Clinical Impressions(s) / ED Diagnoses   Final diagnoses:  Fall, initial encounter  Contusion of back, unspecified laterality, initial encounter    ED Discharge Orders    None       Tanna Furry, MD 04/16/17 2338

## 2020-08-27 ENCOUNTER — Other Ambulatory Visit: Payer: Self-pay

## 2020-08-27 ENCOUNTER — Emergency Department (HOSPITAL_COMMUNITY)
Admission: EM | Admit: 2020-08-27 | Discharge: 2020-08-27 | Disposition: A | Discharge status: Home / Self Care | Payer: Medicare PPO | Attending: Emergency Medicine | Admitting: Emergency Medicine

## 2020-08-27 ENCOUNTER — Encounter (HOSPITAL_COMMUNITY): Payer: Self-pay

## 2020-08-27 ENCOUNTER — Emergency Department (HOSPITAL_COMMUNITY): Payer: Medicare PPO

## 2020-08-27 DIAGNOSIS — Z79899 Other long term (current) drug therapy: Secondary | ICD-10-CM | POA: Diagnosis not present

## 2020-08-27 DIAGNOSIS — Z87891 Personal history of nicotine dependence: Secondary | ICD-10-CM | POA: Diagnosis not present

## 2020-08-27 DIAGNOSIS — I10 Essential (primary) hypertension: Secondary | ICD-10-CM | POA: Insufficient documentation

## 2020-08-27 DIAGNOSIS — Z20822 Contact with and (suspected) exposure to covid-19: Secondary | ICD-10-CM | POA: Insufficient documentation

## 2020-08-27 DIAGNOSIS — R531 Weakness: Secondary | ICD-10-CM | POA: Diagnosis not present

## 2020-08-27 DIAGNOSIS — R21 Rash and other nonspecific skin eruption: Secondary | ICD-10-CM | POA: Insufficient documentation

## 2020-08-27 LAB — CBC WITH DIFFERENTIAL/PLATELET
Abs Immature Granulocytes: 0.02 10*3/uL (ref 0.00–0.07)
Basophils Absolute: 0 10*3/uL (ref 0.0–0.1)
Basophils Relative: 0 %
Eosinophils Absolute: 0 10*3/uL (ref 0.0–0.5)
Eosinophils Relative: 0 %
HCT: 43.2 % (ref 39.0–52.0)
Hemoglobin: 14.6 g/dL (ref 13.0–17.0)
Immature Granulocytes: 0 %
Lymphocytes Relative: 29 %
Lymphs Abs: 2 10*3/uL (ref 0.7–4.0)
MCH: 31.9 pg (ref 26.0–34.0)
MCHC: 33.8 g/dL (ref 30.0–36.0)
MCV: 94.3 fL (ref 80.0–100.0)
Monocytes Absolute: 0.8 10*3/uL (ref 0.1–1.0)
Monocytes Relative: 12 %
Neutro Abs: 3.9 10*3/uL (ref 1.7–7.7)
Neutrophils Relative %: 59 %
Platelets: 167 10*3/uL (ref 150–400)
RBC: 4.58 MIL/uL (ref 4.22–5.81)
RDW: 13.4 % (ref 11.5–15.5)
WBC: 6.7 10*3/uL (ref 4.0–10.5)
nRBC: 0 % (ref 0.0–0.2)

## 2020-08-27 LAB — URINALYSIS, ROUTINE W REFLEX MICROSCOPIC
Bacteria, UA: NONE SEEN
Bilirubin Urine: NEGATIVE
Glucose, UA: NEGATIVE mg/dL
Hgb urine dipstick: NEGATIVE
Ketones, ur: NEGATIVE mg/dL
Nitrite: NEGATIVE
Protein, ur: NEGATIVE mg/dL
Specific Gravity, Urine: 1.003 — ABNORMAL LOW (ref 1.005–1.030)
pH: 7 (ref 5.0–8.0)

## 2020-08-27 LAB — COMPREHENSIVE METABOLIC PANEL
ALT: 26 U/L (ref 0–44)
AST: 36 U/L (ref 15–41)
Albumin: 3.9 g/dL (ref 3.5–5.0)
Alkaline Phosphatase: 141 U/L — ABNORMAL HIGH (ref 38–126)
Anion gap: 8 (ref 5–15)
BUN: 17 mg/dL (ref 8–23)
CO2: 29 mmol/L (ref 22–32)
Calcium: 9.2 mg/dL (ref 8.9–10.3)
Chloride: 101 mmol/L (ref 98–111)
Creatinine, Ser: 0.81 mg/dL (ref 0.61–1.24)
GFR, Estimated: >60 mL/min (ref >60)
Glucose, Bld: 105 mg/dL — ABNORMAL HIGH (ref 70–99)
Potassium: 3.8 mmol/L (ref 3.5–5.1)
Sodium: 138 mmol/L (ref 135–145)
Total Bilirubin: 0.7 mg/dL (ref 0.3–1.2)
Total Protein: 7.3 g/dL (ref 6.5–8.1)

## 2020-08-27 LAB — RESP PANEL BY RT-PCR (FLU A&B, COVID) ARPGX2
Influenza A by PCR: NEGATIVE
Influenza B by PCR: NEGATIVE
SARS Coronavirus 2 by RT PCR: NEGATIVE

## 2020-08-27 MED ORDER — ACETAMINOPHEN 325 MG PO TABS
650.0000 mg | ORAL_TABLET | Freq: Once | ORAL | Status: AC
  Administered 2020-08-27: 21:00:00 650 mg via ORAL
  Filled 2020-08-27: qty 2

## 2020-08-27 MED ORDER — CLOTRIMAZOLE-BETAMETHASONE 1-0.05 % EX CREA: TOPICAL_CREAM | CUTANEOUS | 0 refills | Status: AC

## 2020-08-27 NOTE — Discharge Instructions (Addendum)
Follow-up with your family doctor next week for recheck. 

## 2020-08-27 NOTE — ED Provider Notes (Signed)
[facility] EMERGENCY DEPARTMENT Provider Note   CSN: [id number] Arrival date & time: 08/27/20  1938     History Chief Complaint  Patient presents with   Weakness    Charles Ballard is a 87 y.o. male.  Patient complains of general weakness  The history is provided by the patient and medical records. No language interpreter was used.  Weakness Severity:  Mild Onset quality:  Sudden Duration: On day. Timing:  Intermittent Progression:  Waxing and waning Chronicity:  New Context: not alcohol use   Relieved by:  Nothing Worsened by:  Nothing Ineffective treatments:  None tried Associated symptoms: no abdominal pain, no chest pain, no cough, no diarrhea, no frequency, no headaches and no seizures       Past Medical History:  Diagnosis Date   High triglycerides    Hypertension     Patient Active Problem List   Diagnosis Date Noted   GI bleed 03/09/2013   Hypertension 03/09/2013   Elevated lipase 03/09/2013   Elevated lactic acid level 03/09/2013    Past Surgical History:  Procedure Laterality Date   COLONOSCOPY N/A 03/09/2013   Procedure: COLONOSCOPY;  Surgeon: Rogene Houston, MD;  Location: [facility];  Service: Endoscopy;  Laterality: N/A;   Skin Grafts  1933       History reviewed. No pertinent family history.  Social History   Tobacco Use   Smoking status: Former    Years: 10.00    Pack years: 0.00    Types: Cigarettes   Smokeless tobacco: Former    Quit date: 02/24/1964  Substance Use Topics   Alcohol use: No   Drug use: No    Home Medications Prior to Admission medications   Medication Sig Start Date End Date Taking? Authorizing Provider  clotrimazole-betamethasone (LOTRISONE) cream Apply to affected area 2 times daily prn 08/27/20  Yes Orey Ferguson, MD  lisinopril (PRINIVIL,ZESTRIL) 20 MG tablet Take 20 mg by mouth daily.    [provider]  Omega 3 1000 MG CAPS Take 1,000 mg by mouth daily.    [provider]   pantoprazole (PROTONIX) 40 MG tablet Take 1 tablet (40 mg total) by mouth daily. 03/11/13   Kathie Dike, MD  torsemide (DEMADEX) 10 MG tablet Take 10 mg by mouth daily. 06/21/20   [provider]    Allergies    Patient has no known allergies.  Review of Systems   Review of Systems  Constitutional:  Negative for appetite change and fatigue.  HENT:  Negative for congestion, ear discharge and sinus pressure.   Eyes:  Negative for discharge.  Respiratory:  Negative for cough.   Cardiovascular:  Negative for chest pain.  Gastrointestinal:  Negative for abdominal pain and diarrhea.  Genitourinary:  Negative for frequency and hematuria.  Musculoskeletal:  Negative for back pain.  Skin:  Negative for rash.  Neurological:  Positive for weakness. Negative for seizures and headaches.  Psychiatric/Behavioral:  Negative for hallucinations.    Physical Exam Updated Vital Signs BP (!) 170/91   Pulse 74   Temp 98.1 F (36.7 C)   Resp 18   Ht 5\' 5"  (1.651 m)   Wt 84.2 kg   SpO2 96%   BMI 30.89 kg/m   Physical Exam Vitals and nursing note reviewed.  Constitutional:      Appearance: He is well-developed.  HENT:     Head: Normocephalic.     Nose: Nose normal.  Eyes:     General: No scleral icterus.  Conjunctiva/sclera: Conjunctivae normal.  Neck:     Thyroid: No thyromegaly.  Cardiovascular:     Rate and Rhythm: Normal rate and regular rhythm.     Heart sounds: No murmur heard.   No friction rub. No gallop.  Pulmonary:     Breath sounds: No stridor. No wheezing or rales.  Chest:     Chest wall: No tenderness.  Abdominal:     General: There is no distension.     Tenderness: There is no abdominal tenderness. There is no rebound.  Musculoskeletal:        General: Normal range of motion.     Cervical back: Neck supple.  Lymphadenopathy:     Cervical: No cervical adenopathy.  Skin:    Findings: Rash present. No erythema.  Neurological:     Mental Status: He is  alert and oriented to person, place, and time.     Motor: No abnormal muscle tone.     Coordination: Coordination normal.  Psychiatric:        Behavior: Behavior normal.    ED Results / Procedures / Treatments   Labs (all labs ordered are listed, but only abnormal results are displayed) Labs Reviewed  COMPREHENSIVE METABOLIC PANEL - Abnormal; Notable for the following components:      Result Value   Glucose, Bld 105 (*)    Alkaline Phosphatase 141 (*)    All other components within normal limits  URINALYSIS, ROUTINE W REFLEX MICROSCOPIC - Abnormal; Notable for the following components:   Color, Urine STRAW (*)    Specific Gravity, Urine 1.003 (*)    Leukocytes,Ua TRACE (*)    All other components within normal limits  RESP PANEL BY RT-PCR (FLU A&B, COVID) ARPGX2  CBC WITH DIFFERENTIAL/PLATELET    EKG None  Radiology DG Chest Port 1 View  Result Date: 08/27/2020 CLINICAL DATA:  Shortness of breath EXAM: PORTABLE CHEST 1 VIEW COMPARISON:  04/16/2017 FINDINGS: Lungs are clear.  No pleural effusion or pneumothorax. Chronic blunting of the left costophrenic angle. The heart is normal in size.  Thoracic aortic atherosclerosis. Chronic degenerative changes of the left shoulder. IMPRESSION: No evidence of acute cardiopulmonary disease. Electronically Signed   By: Julian Hy M.D.   On: 08/27/2020 21:13    Procedures Procedures   Medications Ordered in ED Medications  acetaminophen (TYLENOL) tablet 650 mg (650 mg Oral Given 08/27/20 2059)    ED Course  I have reviewed the triage vital signs and the nursing notes.  Pertinent labs & imaging results that were available during my care of the patient were reviewed by me and considered in my medical decision making (see chart for details).    MDM Rules/Calculators/A&P                          Labs x-rays urinalysis are all unremarkable.  Patient will be discharged home to follow-up with his PCP.  He does have a rash under his  left side of his chest fold.  Looks like a fungal rash.  He is put on Lotrisone final Clinical Impression(s) / ED Diagnoses Final diagnoses:  Weakness    Rx / DC Orders ED Discharge Orders          Ordered    clotrimazole-betamethasone (LOTRISONE) cream        08/27/20 2301             Zorion Ferguson, MD 08/28/20 1017

## 2020-08-27 NOTE — ED Triage Notes (Signed)
Pt c/o increased weakness, headache, and nausea starting at 4 pm.

## 2022-01-03 IMAGING — DX DG CHEST 1V PORT
1 series · 1 of 1 positions shown · non-contrast
Comparison: 04/16/2017

CLINICAL DATA: Shortness of breath

EXAM:
PORTABLE CHEST 1 VIEW

[chest ap]
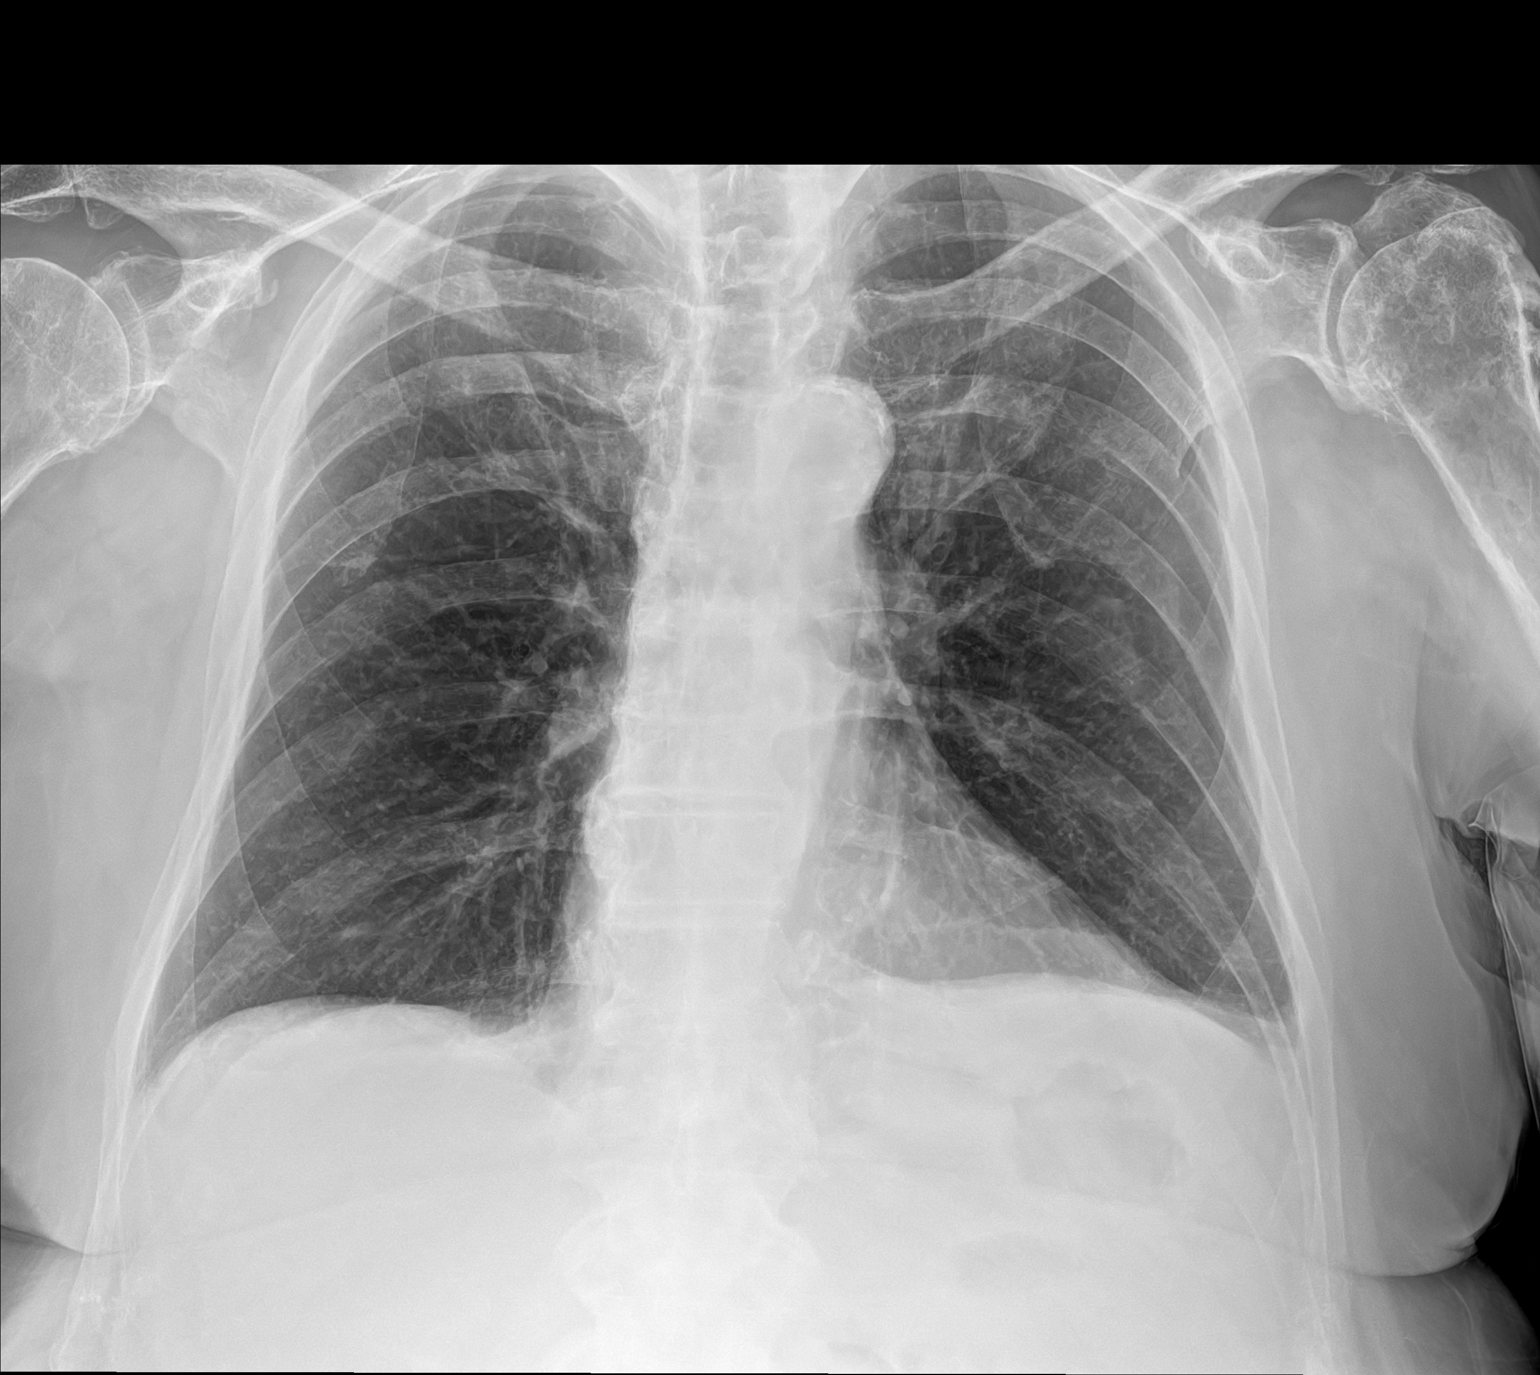

[1 of 1 positions shown; findings below may reference images not displayed]

FINDINGS: Lungs are clear.  No pleural effusion or pneumothorax.

Chronic blunting of the left costophrenic angle.

The heart is normal in size.  Thoracic aortic atherosclerosis.

Chronic degenerative changes of the left shoulder.
IMPRESSION: No evidence of acute cardiopulmonary disease.
